# Patient Record
Sex: Female | Born: 1945 | Race: White | Hispanic: No | Smoking: Never smoker
Health system: Southern US, Community
[De-identification: ages and names within clinical notes are randomized; demographics above are authoritative.]

## PROBLEM LIST (undated history)

## (undated) DIAGNOSIS — G35 Multiple sclerosis: Secondary | ICD-10-CM

## (undated) DIAGNOSIS — G35D Multiple sclerosis, unspecified: Secondary | ICD-10-CM

---

## 2003-03-23 ENCOUNTER — Emergency Department (HOSPITAL_COMMUNITY): Admission: EM | Admit: 2003-03-23 | Discharge: 2003-03-23 | Payer: Self-pay | Admitting: Emergency Medicine

## 2011-07-06 ENCOUNTER — Encounter: Payer: Self-pay | Admitting: Family Medicine

## 2012-09-13 ENCOUNTER — Other Ambulatory Visit (HOSPITAL_COMMUNITY): Payer: Self-pay | Admitting: Family Medicine

## 2012-09-13 ENCOUNTER — Ambulatory Visit (HOSPITAL_COMMUNITY)
Admission: RE | Admit: 2012-09-13 | Discharge: 2012-09-13 | Disposition: A | Discharge status: Home / Self Care | Payer: Medicare Other | Source: Ambulatory Visit | Attending: Family Medicine | Admitting: Family Medicine

## 2012-09-13 ENCOUNTER — Ambulatory Visit (HOSPITAL_COMMUNITY): Admission: RE | Admit: 2012-09-13 | Payer: Self-pay | Source: Ambulatory Visit

## 2012-09-13 DIAGNOSIS — R609 Edema, unspecified: Secondary | ICD-10-CM

## 2012-09-13 DIAGNOSIS — R52 Pain, unspecified: Secondary | ICD-10-CM

## 2012-09-13 DIAGNOSIS — M79609 Pain in unspecified limb: Secondary | ICD-10-CM | POA: Insufficient documentation

## 2016-03-13 ENCOUNTER — Emergency Department (HOSPITAL_COMMUNITY): Payer: Medicare Other

## 2016-03-13 ENCOUNTER — Encounter (HOSPITAL_COMMUNITY): Payer: Self-pay | Admitting: Emergency Medicine

## 2016-03-13 ENCOUNTER — Emergency Department (HOSPITAL_COMMUNITY)
Admission: EM | Admit: 2016-03-13 | Discharge: 2016-03-14 | Disposition: A | Discharge status: Home / Self Care | Payer: Medicare Other | Attending: Emergency Medicine | Admitting: Emergency Medicine

## 2016-03-13 DIAGNOSIS — Z791 Long term (current) use of non-steroidal anti-inflammatories (NSAID): Secondary | ICD-10-CM | POA: Insufficient documentation

## 2016-03-13 DIAGNOSIS — S42202A Unspecified fracture of upper end of left humerus, initial encounter for closed fracture: Secondary | ICD-10-CM | POA: Diagnosis not present

## 2016-03-13 DIAGNOSIS — W108XXA Fall (on) (from) other stairs and steps, initial encounter: Secondary | ICD-10-CM | POA: Diagnosis not present

## 2016-03-13 DIAGNOSIS — S6992XA Unspecified injury of left wrist, hand and finger(s), initial encounter: Secondary | ICD-10-CM | POA: Diagnosis present

## 2016-03-13 DIAGNOSIS — S92352A Displaced fracture of fifth metatarsal bone, left foot, initial encounter for closed fracture: Secondary | ICD-10-CM | POA: Diagnosis not present

## 2016-03-13 DIAGNOSIS — Y999 Unspecified external cause status: Secondary | ICD-10-CM | POA: Diagnosis not present

## 2016-03-13 DIAGNOSIS — S92302A Fracture of unspecified metatarsal bone(s), left foot, initial encounter for closed fracture: Secondary | ICD-10-CM

## 2016-03-13 DIAGNOSIS — Z79899 Other long term (current) drug therapy: Secondary | ICD-10-CM | POA: Diagnosis not present

## 2016-03-13 DIAGNOSIS — Y939 Activity, unspecified: Secondary | ICD-10-CM | POA: Diagnosis not present

## 2016-03-13 DIAGNOSIS — Y92009 Unspecified place in unspecified non-institutional (private) residence as the place of occurrence of the external cause: Secondary | ICD-10-CM | POA: Diagnosis not present

## 2016-03-13 MED ORDER — MORPHINE SULFATE (PF) 4 MG/ML IV SOLN
4.0000 mg | Freq: Once | INTRAVENOUS | Status: AC
  Administered 2016-03-13: 4 mg via INTRAVENOUS
  Filled 2016-03-13: qty 1

## 2016-03-13 NOTE — ED Notes (Signed)
Bed: XQ11 Expected date:  Expected time:  Means of arrival:  Comments: 72 F fall/shoulder deformity

## 2016-03-13 NOTE — ED Provider Notes (Signed)
CSN: [id number]     Arrival date & time 03/13/16  2154 History   First MD Initiated Contact with Patient 03/13/16 2222     Chief Complaint  Patient presents with  . Fall  . Shoulder Injury     (Consider location/radiation/quality/duration/timing/severity/associated sxs/prior Treatment) HPI  70 year old female presents after a fall. She was on the porch when she accidentally missed a step on a 2 level porch. Patient fell and landed on the left arm and foot. Complaining of distal foot pain as well as severe left shoulder pain. Given 100 g fentanyl in route by EMS. Also has left forearm pain with abrasion. Last tetanus immunization was 5 years ago. Did not hit her head or lose consciousness. 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 No blood thinners. Pain is moderate at this time.  History reviewed. No pertinent past medical history. History reviewed. No pertinent past surgical history. History reviewed. No pertinent family history. Social History  Substance Use Topics  . Smoking status: Never Smoker   . Smokeless tobacco: Never Used  . Alcohol Use: No   OB History    No data available     Review of Systems  Respiratory: Negative for shortness of breath.   Cardiovascular: Negative for chest pain.  Gastrointestinal: Negative for vomiting.  Musculoskeletal: Positive for joint swelling and arthralgias.  Neurological: Positive for weakness (chronic, left sided; mild). Negative for dizziness and headaches.  All other systems reviewed and are negative.     Allergies  Penicillins  Home Medications   Prior to Admission medications   Medication Sig Start Date End Date Taking? Authorizing Provider  baclofen (LIORESAL) 10 MG tablet Take 10 mg by mouth 3 (three) times daily as needed for muscle spasms.   Yes Historical Provider, MD  Cholecalciferol (VITAMIN D) 2000 units CAPS Take 2,000 Units by mouth  daily.   Yes Historical Provider, MD  ibuprofen (ADVIL,MOTRIN) 200 MG tablet Take 200 mg by mouth every 6 (six) hours as needed (for pain.).   Yes Historical Provider, MD   BP 140/89 mmHg  Pulse 91  Temp(Src) 97.7 F (36.5 C) (Oral)  Resp 20  SpO2 99% Physical Exam  Constitutional: She is oriented to person, place, and time. She appears well-developed and well-nourished.  HENT:  Head: Normocephalic and atraumatic.  Right Ear: External ear normal.  Left Ear: External ear normal.  Nose: Nose normal.  Eyes: Right eye exhibits no discharge. Left eye exhibits no discharge.  Cardiovascular: Normal rate, regular rhythm and normal heart sounds.   Pulses:      Radial pulses are 2+ on the left side.       Dorsalis pedis pulses are 2+ on the right side, and 2+ on the left side.  Pulmonary/Chest: Effort normal and breath sounds normal.  Abdominal: Soft. There is no tenderness.  Musculoskeletal:       Left shoulder: She exhibits decreased range of motion, tenderness, bony tenderness and swelling. She exhibits normal pulse and normal strength.       Left elbow: No tenderness found.       Left ankle: She exhibits normal range of motion. No tenderness.       Left forearm: She exhibits tenderness and swelling.       Arms:      Left foot: There is tenderness (distal).       Feet:  Neurological: She is alert and  oriented to person, place, and time.  Skin: Skin is warm and dry.  Nursing note and vitals reviewed.   ED Course  Procedures (including critical care time) Labs Review Labs Reviewed - No data to display  Imaging Review Dg Forearm Left  03/13/2016  CLINICAL DATA:  Fall, shoulder injury EXAM: LEFT FOREARM - 2 VIEW COMPARISON:  None. FINDINGS: No fracture or dislocation is seen. The joint spaces are preserved. The visualized soft tissues are unremarkable. IMPRESSION: No fracture or dislocation is seen. Electronically Signed   By: Charline Bills M.D.   On: 03/13/2016 23:15   Dg  Ankle Complete Left  03/13/2016  CLINICAL DATA:  Fall EXAM: LEFT ANKLE COMPLETE - 3+ VIEW COMPARISON:  None. FINDINGS: No fracture or dislocation is seen. The ankle mortise is intact. The base of the fifth metatarsal is unremarkable. The visualized soft tissues are unremarkable. IMPRESSION: No fracture or dislocation is seen. Electronically Signed   By: Charline Bills M.D.   On: 03/13/2016 22:41   Ct Shoulder Left Wo Contrast  03/14/2016  CLINICAL DATA:  Left humeral fracture characterization. Initial encounter. EXAM: CT OF THE LEFT SHOULDER WITHOUT CONTRAST TECHNIQUE: Multidetector CT imaging was performed according to the standard protocol. Multiplanar CT image reconstructions were also generated. COMPARISON:  Radiography from earlier today FINDINGS: Highly comminuted proximal humerus with fracture involving surgical neck, anatomic neck, and both tuberosities. The head is displaced and rotated. Greater tuberosity is displaced by nearly 2 cm. The lesser tuberosity is not displaced over 1 cm. The humeral head is anteroinferiorly subluxed, but located. No scapular or distal clavicle fracture. Expected muscular edema and joint effusion. IMPRESSION: Comminuted proximal humerus fracture as described above. Electronically Signed   By: Marnee Spring M.D.   On: 03/14/2016 00:04   Dg Shoulder Left  03/13/2016  CLINICAL DATA:  Status post fall, left shoulder pain EXAM: LEFT SHOULDER - 2+ VIEW COMPARISON:  None. FINDINGS: Three-part humeral head/neck fracture. Fracture fragments are mildly displaced posteriorly and laterally. No evidence of dislocation. The visualized soft tissues are unremarkable. Visualized left lung is clear. IMPRESSION: Three-part humeral head/ neck fracture, as above. Electronically Signed   By: Charline Bills M.D.   On: 03/13/2016 22:41   Dg Foot Complete Left  03/13/2016  CLINICAL DATA:  Fall, dorsal foot pain EXAM: LEFT FOOT - COMPLETE 3+ VIEW COMPARISON:  None. FINDINGS: Mild  irregularity of the distal 5th metatarsal shaft. Correlate for point tenderness. Otherwise, no fracture is seen. Degenerative changes of the 2nd through 5th metacarpal heads. Joint spaces are essentially preserved. Osteopenia. The visualized soft tissues are unremarkable. IMPRESSION: Mild irregularity of the distal 5th metatarsal shaft. Correlate for point tenderness to exclude buckle fracture. Otherwise, no fracture is seen. Electronically Signed   By: Charline Bills M.D.   On: 03/13/2016 22:43   I have personally reviewed and evaluated these images and lab results as part of my medical decision-making.   EKG Interpretation None      MDM   Final diagnoses:  Closed fracture of left proximal humerus, initial encounter  Fracture of fifth metatarsal bone, left, closed, initial encounter    Patient with a proximal humerus fracture after a mechanical fall at home. Overall well-appearing but does have a significant proximal humerus fracture. Discussed with Dr. Luiz Blare, who recommends CT scan as he will likely fix. Will place in post-op shoe for her metatarsal fracture. Is NV intact. Placed in sling, given pain meds, counseled on side effects. Is having family stay and help  her tonight. D/c home in good condition.    Pricilla Loveless, MD 03/14/16 [phone]
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
## 2016-03-14 MED ORDER — OXYCODONE-ACETAMINOPHEN 5-325 MG PO TABS: 1.0000 | ORAL_TABLET | ORAL | Status: DC | PRN

## 2017-10-11 DIAGNOSIS — G35D Multiple sclerosis, unspecified: Secondary | ICD-10-CM | POA: Insufficient documentation

## 2017-11-14 IMAGING — CR DG ANKLE COMPLETE 3+V*L*
3 series · 3 of 3 positions shown · non-contrast
Comparison: None.

CLINICAL DATA: Fall

EXAM:
LEFT ANKLE COMPLETE - 3+ VIEW

[x ankle ap left]
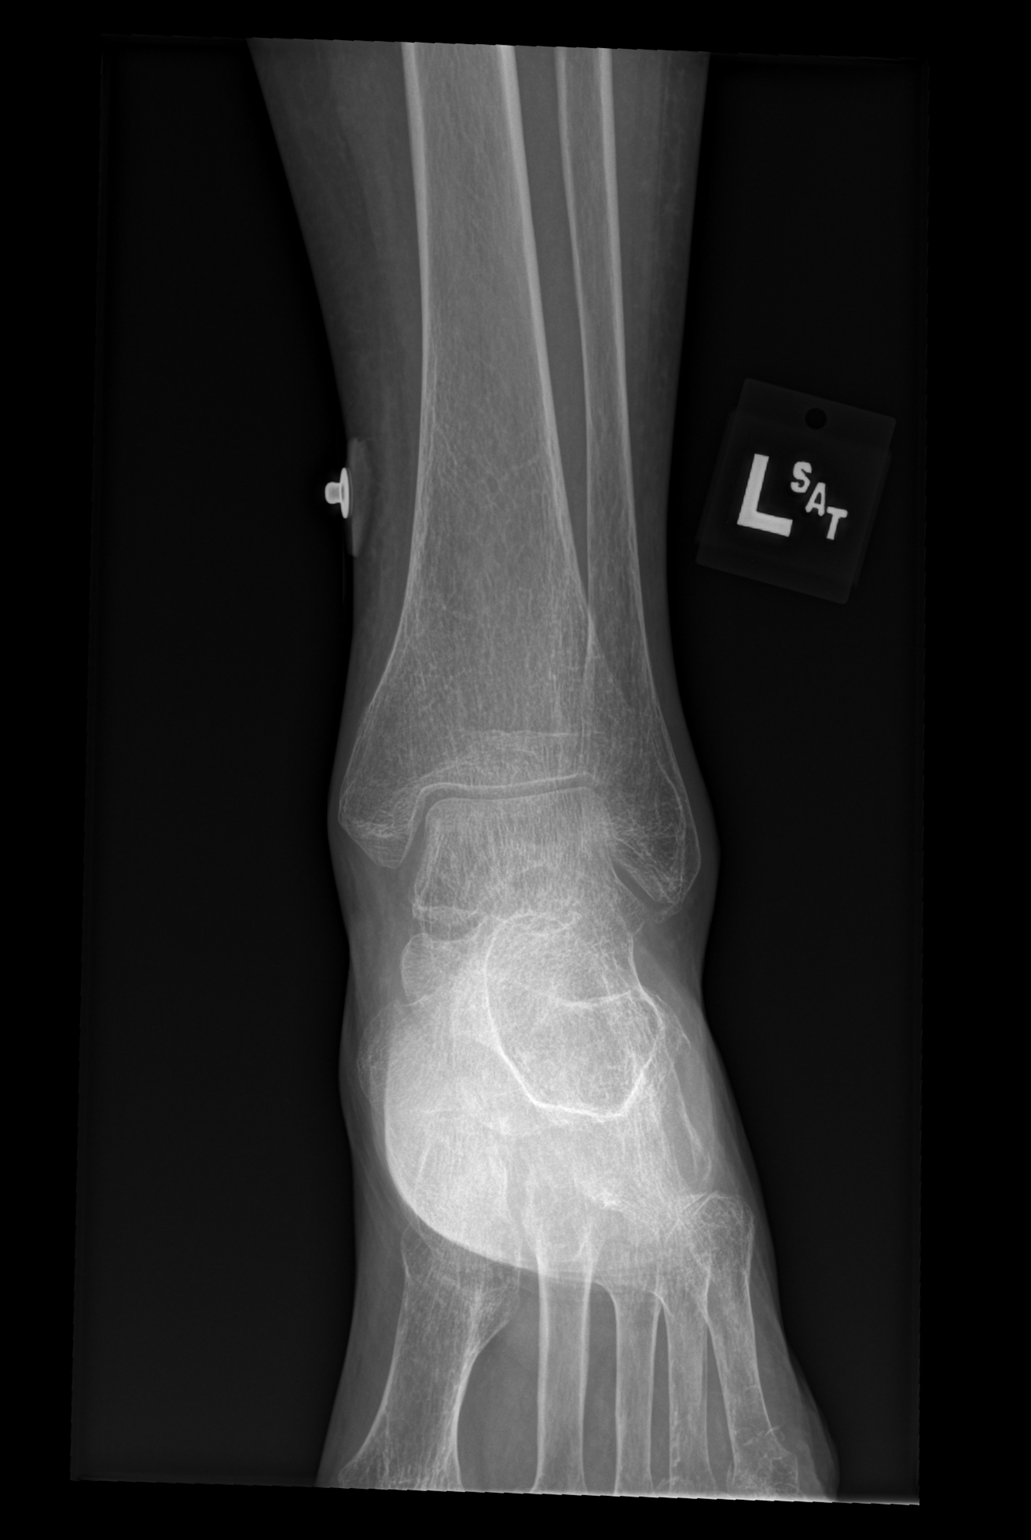

[x ankle obl left]
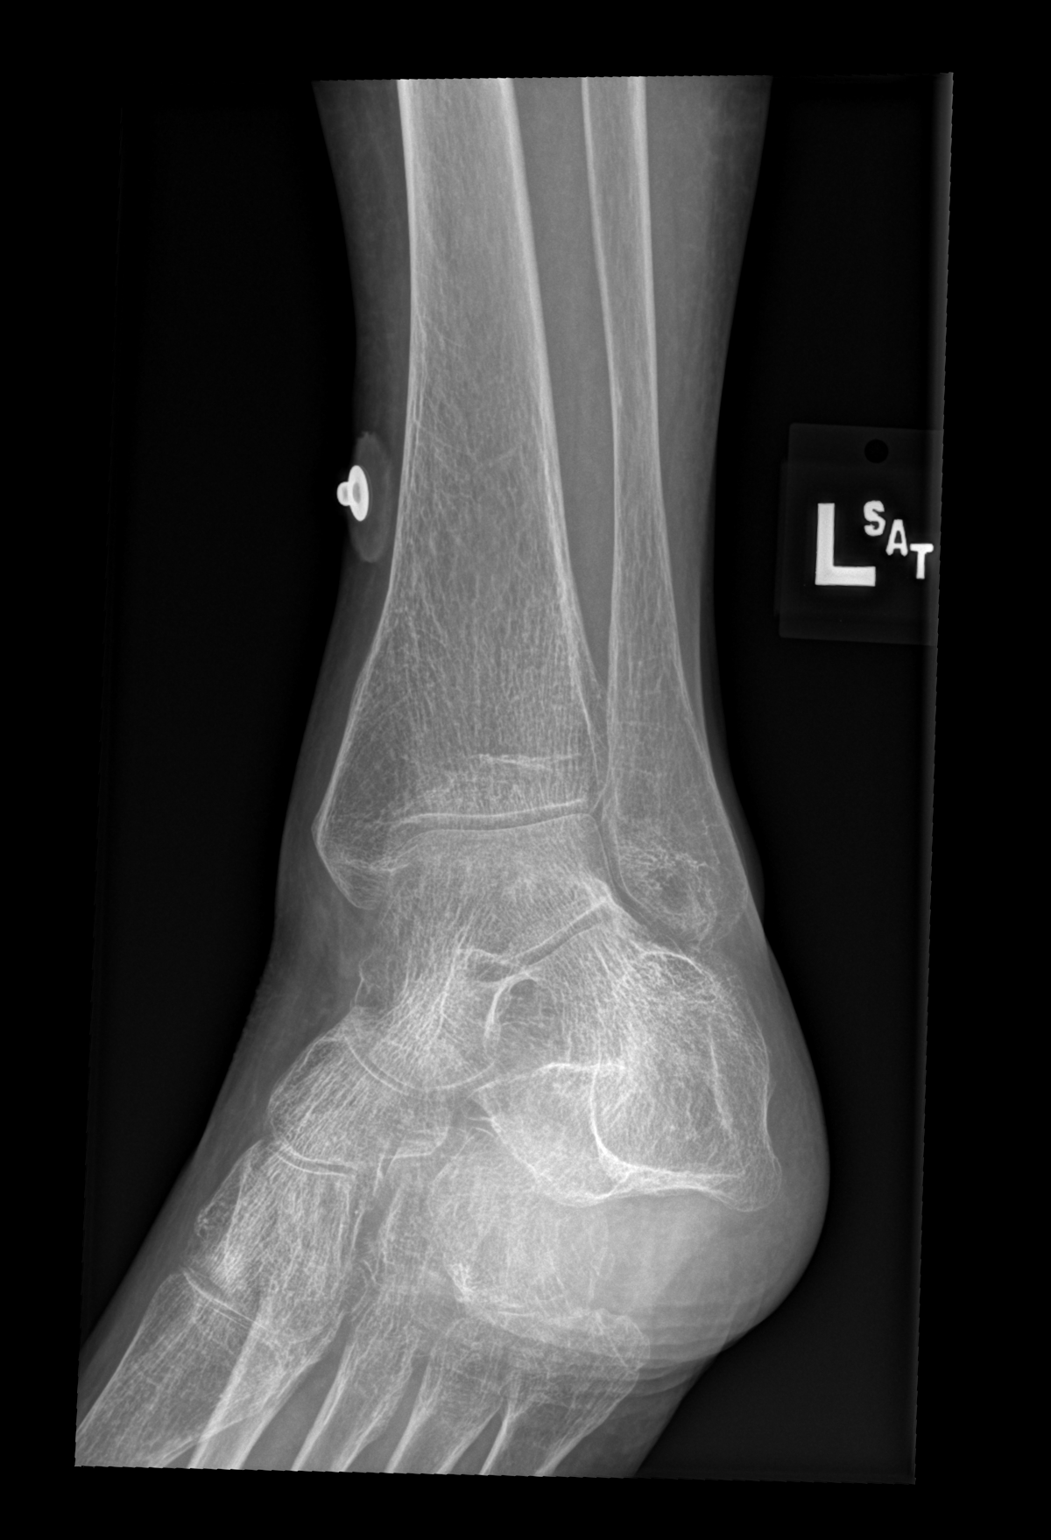

[x ankle lat left]
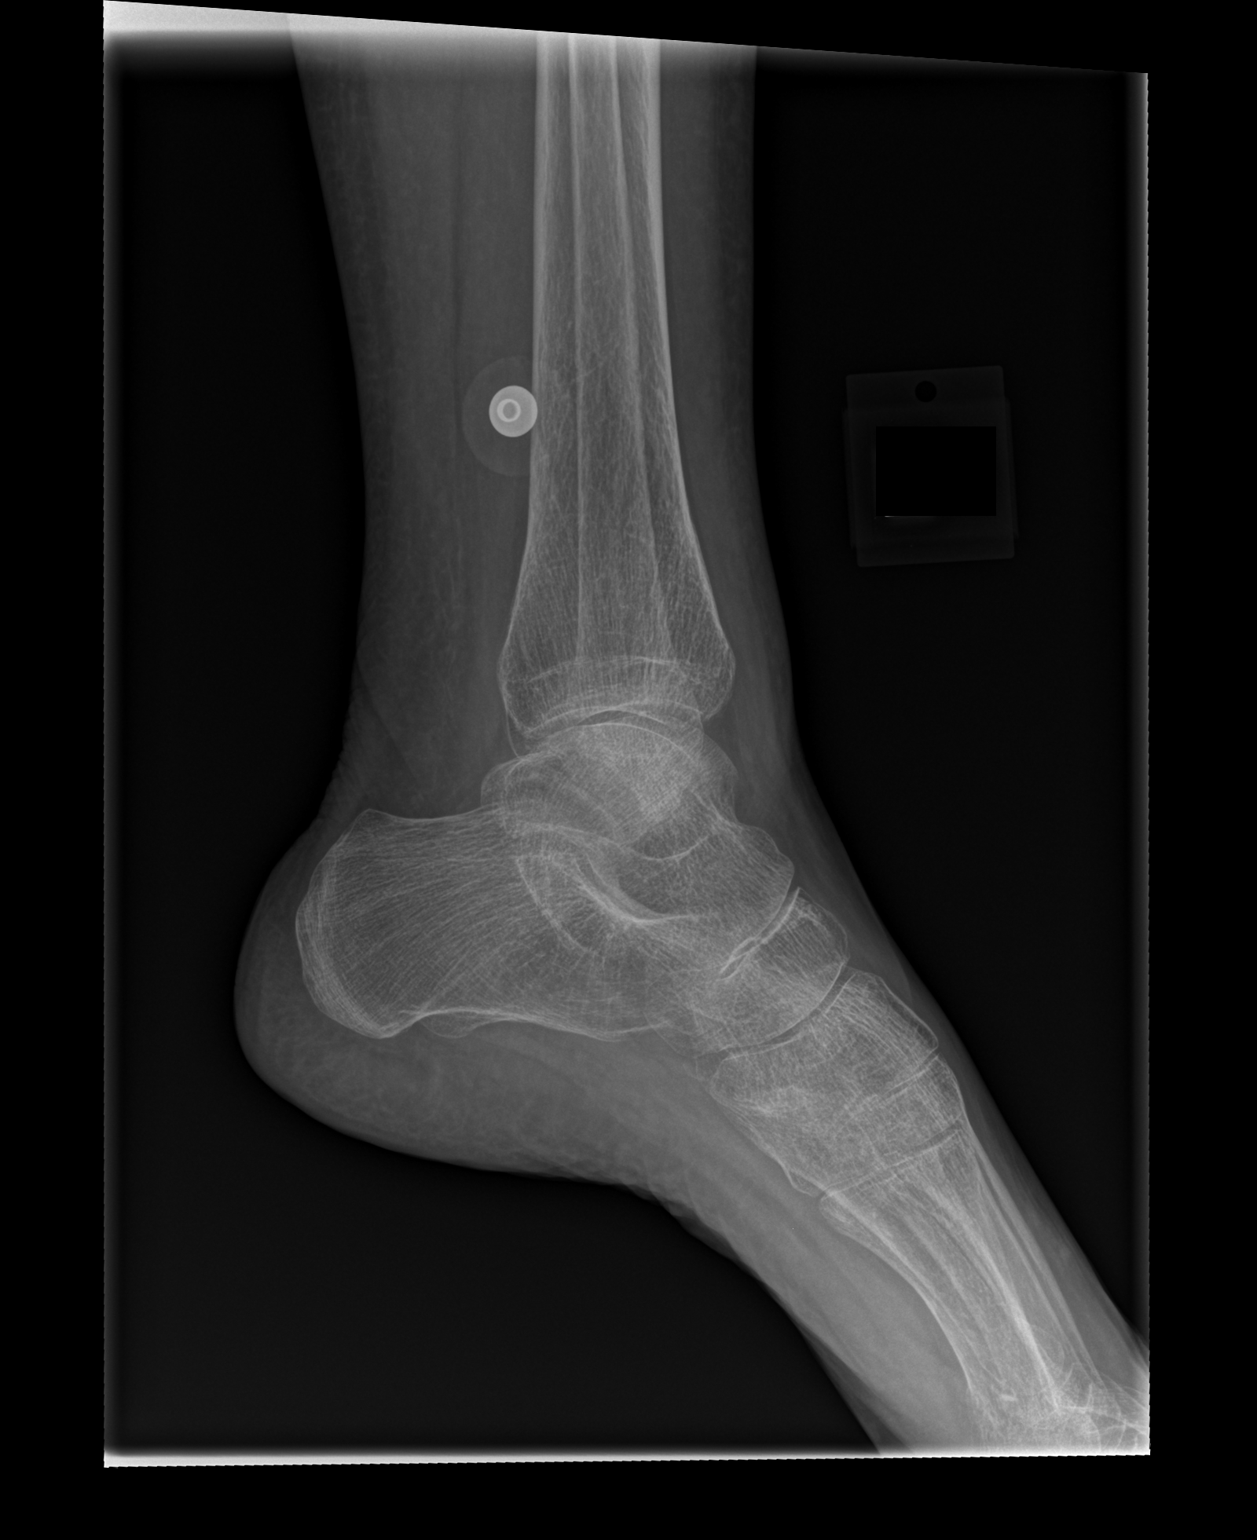

[3 of 3 positions shown; findings below may reference images not displayed]

FINDINGS: No fracture or dislocation is seen.

The ankle mortise is intact.

The base of the fifth metatarsal is unremarkable.

The visualized soft tissues are unremarkable.
IMPRESSION: No fracture or dislocation is seen.

## 2017-11-14 IMAGING — CR DG FOOT COMPLETE 3+V*L*
3 series · 3 of 3 positions shown · non-contrast
Comparison: None.

CLINICAL DATA: Fall, dorsal foot pain

EXAM:
LEFT FOOT - COMPLETE 3+ VIEW

[x foot ap left]
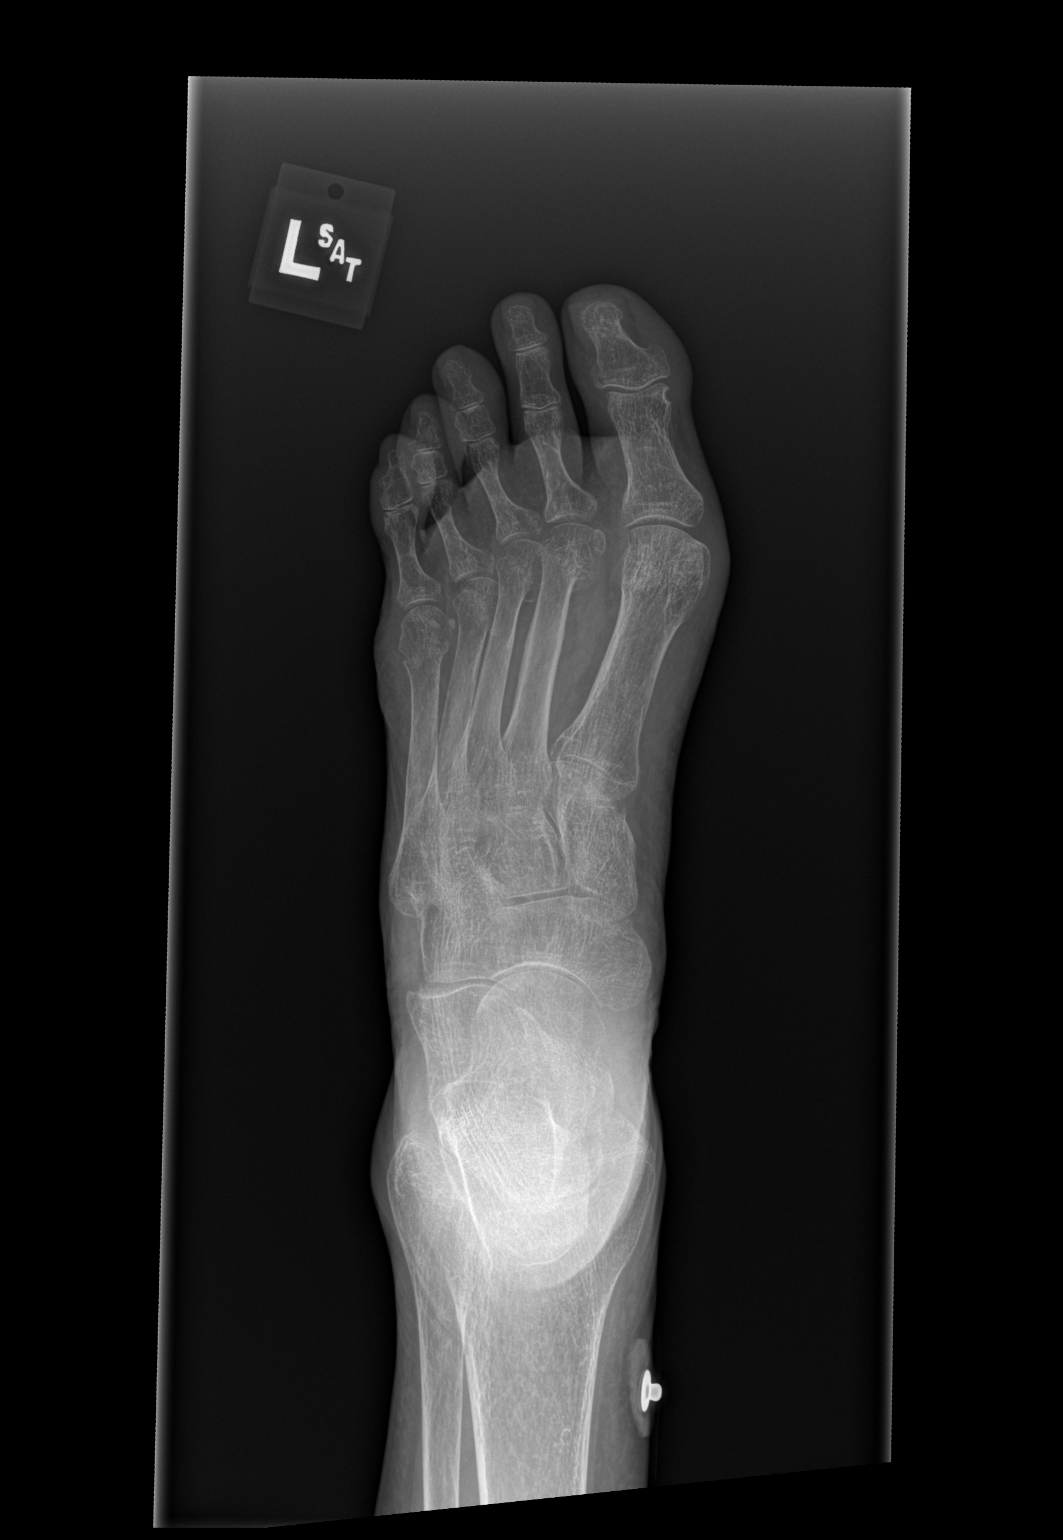

[x foot obl left]
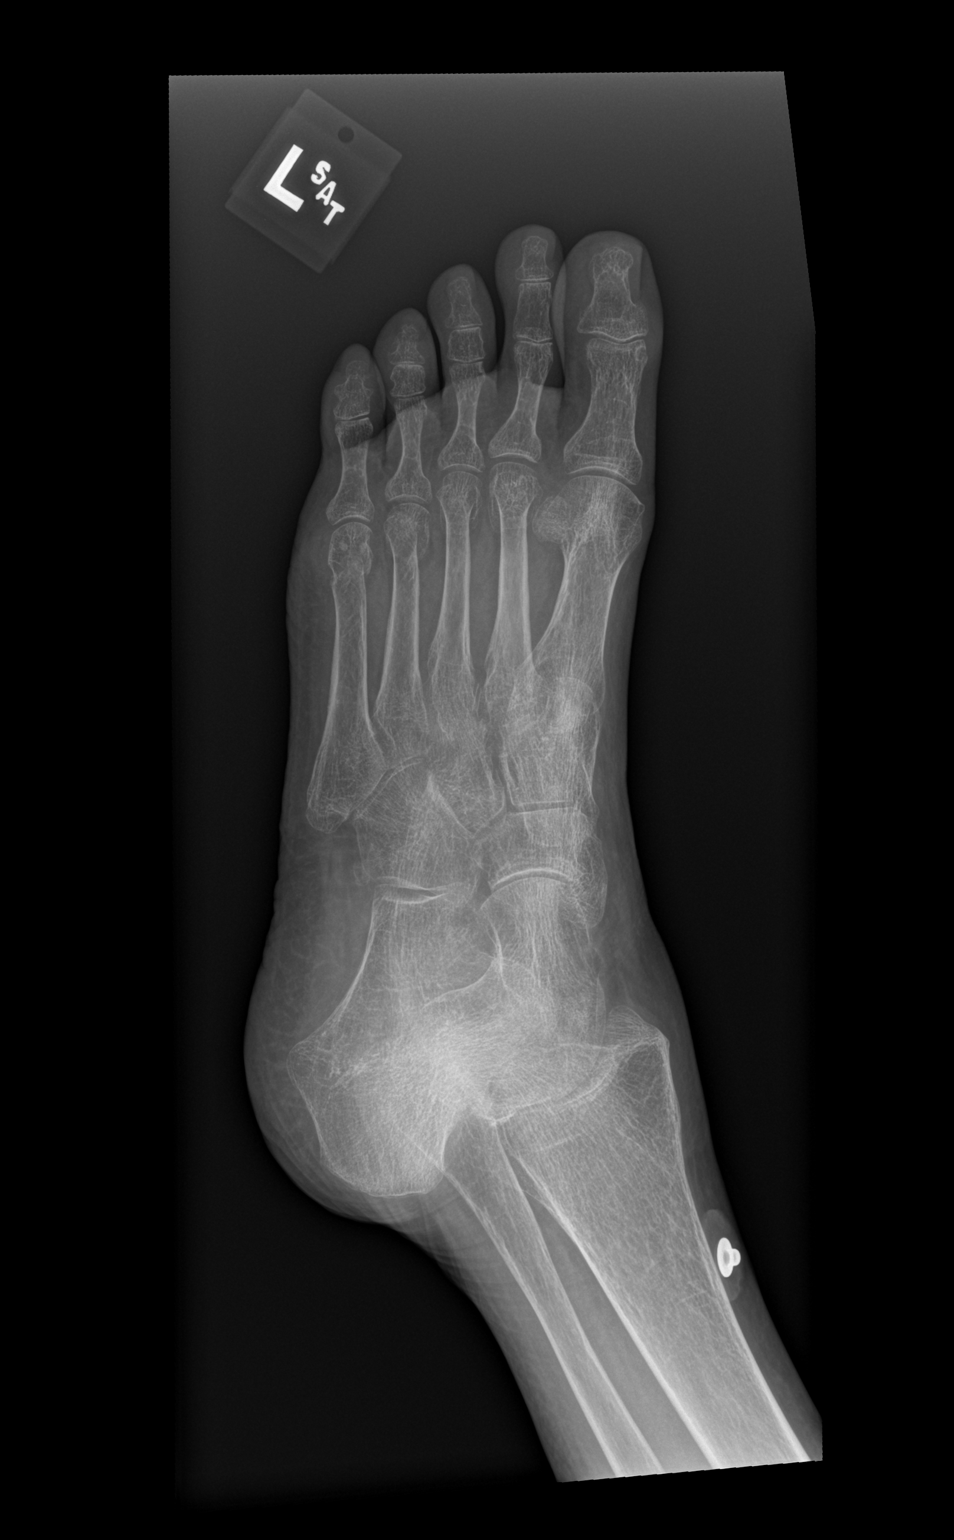

[x foot lat left]
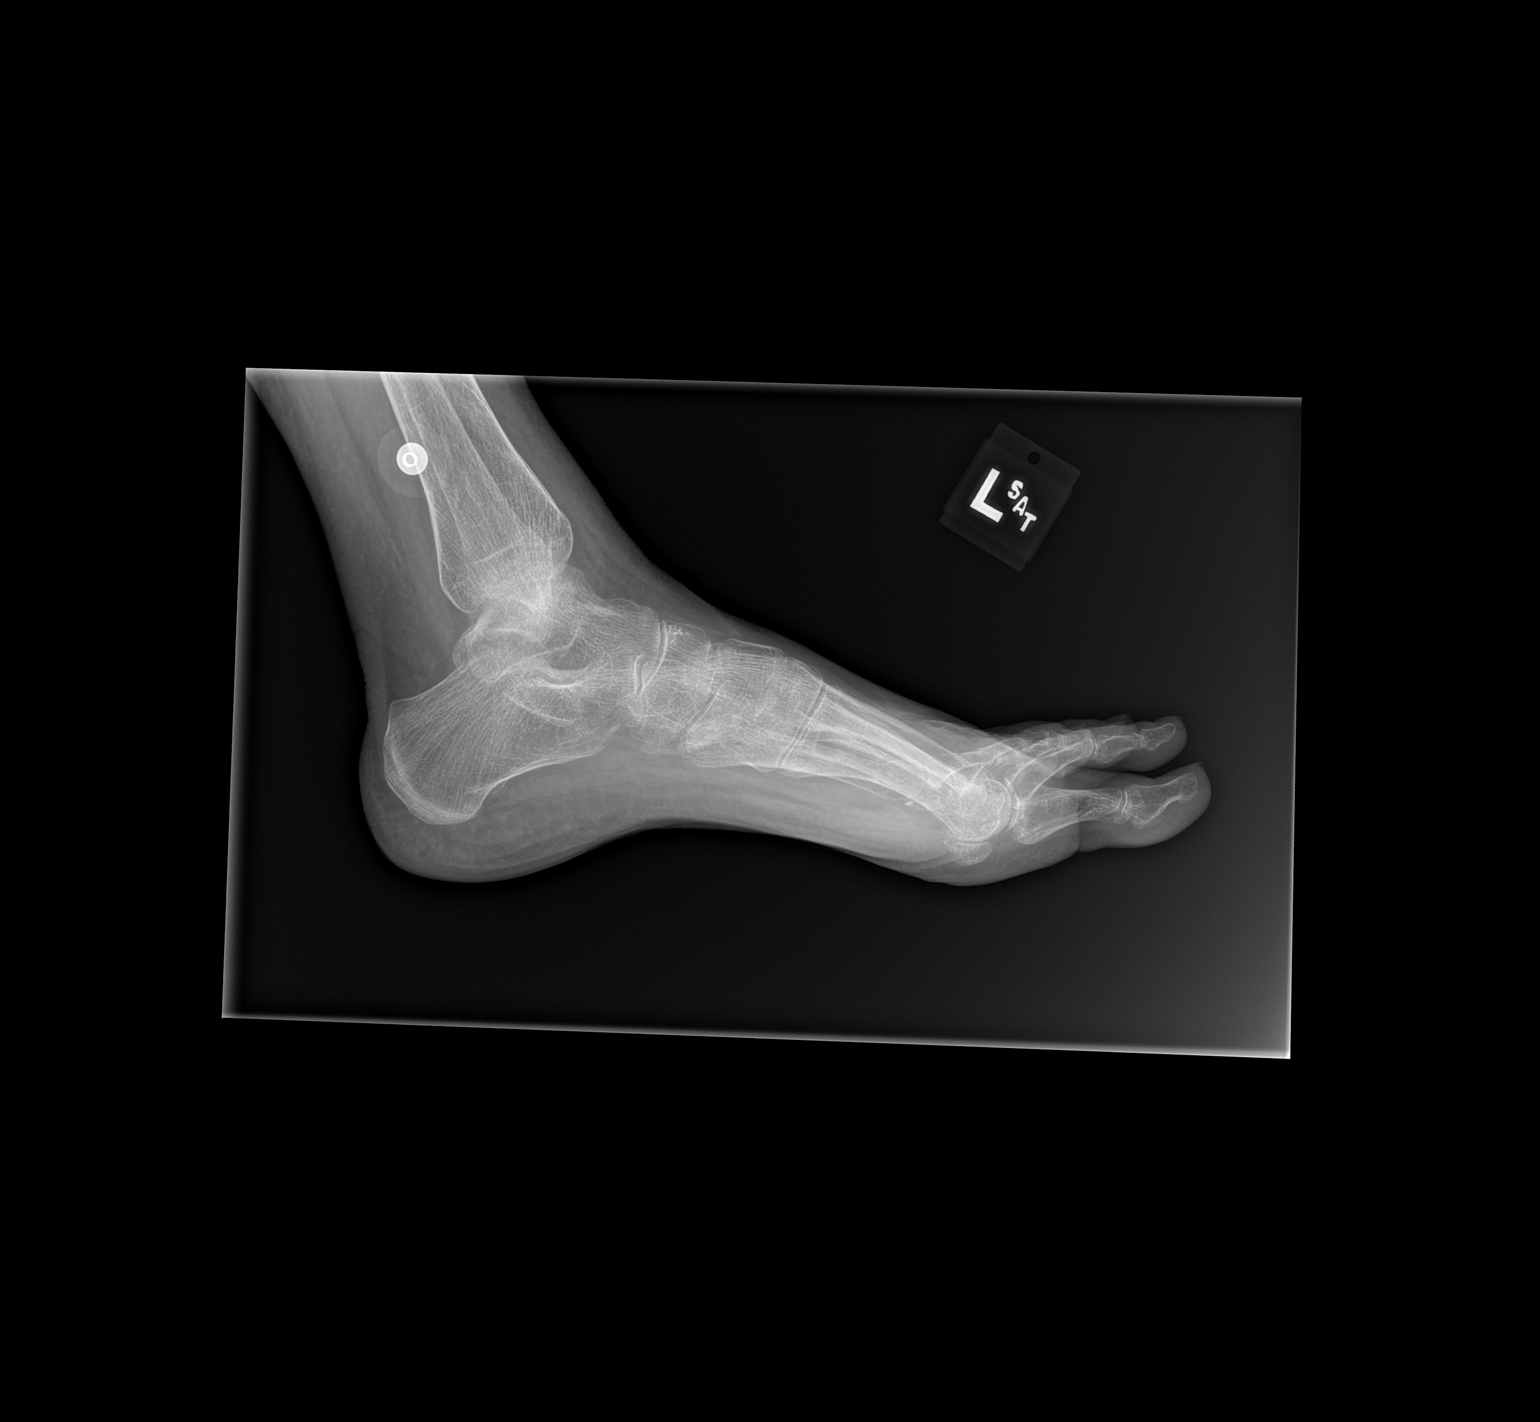

[3 of 3 positions shown; findings below may reference images not displayed]

FINDINGS: Mild irregularity of the distal 5th metatarsal shaft. Correlate for
point tenderness.

Otherwise, no fracture is seen.

Degenerative changes of the 2nd through 5th metacarpal heads.

Joint spaces are essentially preserved.

Osteopenia.

The visualized soft tissues are unremarkable.
IMPRESSION: Mild irregularity of the distal 5th metatarsal shaft. Correlate for
point tenderness to exclude buckle fracture.

Otherwise, no fracture is seen.

## 2023-07-29 ENCOUNTER — Emergency Department (HOSPITAL_COMMUNITY): Payer: No Typology Code available for payment source

## 2023-07-29 ENCOUNTER — Inpatient Hospital Stay (HOSPITAL_COMMUNITY)
Admission: EM | Admit: 2023-07-29 | Discharge: 2023-08-06 | DRG: 086 | Disposition: A | Discharge status: Home Health | Payer: No Typology Code available for payment source | Attending: General Surgery | Admitting: General Surgery

## 2023-07-29 ENCOUNTER — Other Ambulatory Visit: Payer: Self-pay

## 2023-07-29 ENCOUNTER — Encounter (HOSPITAL_COMMUNITY): Payer: Self-pay

## 2023-07-29 DIAGNOSIS — R402141 Coma scale, eyes open, spontaneous, in the field [EMT or ambulance]: Secondary | ICD-10-CM | POA: Diagnosis present

## 2023-07-29 DIAGNOSIS — S2231XA Fracture of one rib, right side, initial encounter for closed fracture: Secondary | ICD-10-CM | POA: Diagnosis present

## 2023-07-29 DIAGNOSIS — R402361 Coma scale, best motor response, obeys commands, in the field [EMT or ambulance]: Secondary | ICD-10-CM | POA: Diagnosis present

## 2023-07-29 DIAGNOSIS — R402251 Coma scale, best verbal response, oriented, in the field [EMT or ambulance]: Secondary | ICD-10-CM | POA: Diagnosis present

## 2023-07-29 DIAGNOSIS — S8255XA Nondisplaced fracture of medial malleolus of left tibia, initial encounter for closed fracture: Secondary | ICD-10-CM | POA: Diagnosis present

## 2023-07-29 DIAGNOSIS — G5692 Unspecified mononeuropathy of left upper limb: Secondary | ICD-10-CM | POA: Diagnosis present

## 2023-07-29 DIAGNOSIS — S065XAA Traumatic subdural hemorrhage with loss of consciousness status unknown, initial encounter: Principal | ICD-10-CM | POA: Diagnosis present

## 2023-07-29 DIAGNOSIS — Y92488 Other paved roadways as the place of occurrence of the external cause: Secondary | ICD-10-CM

## 2023-07-29 DIAGNOSIS — Z79899 Other long term (current) drug therapy: Secondary | ICD-10-CM | POA: Diagnosis not present

## 2023-07-29 DIAGNOSIS — Z880 Allergy status to penicillin: Secondary | ICD-10-CM | POA: Diagnosis not present

## 2023-07-29 DIAGNOSIS — S065X0A Traumatic subdural hemorrhage without loss of consciousness, initial encounter: Secondary | ICD-10-CM | POA: Diagnosis present

## 2023-07-29 DIAGNOSIS — G35 Multiple sclerosis: Secondary | ICD-10-CM | POA: Diagnosis present

## 2023-07-29 HISTORY — DX: Multiple sclerosis, unspecified: G35.D

## 2023-07-29 HISTORY — DX: Multiple sclerosis: G35

## 2023-07-29 LAB — MRSA NEXT GEN BY PCR, NASAL: MRSA by PCR Next Gen: NOT DETECTED

## 2023-07-29 MED ORDER — ORAL CARE MOUTH RINSE: 15.0000 mL | OROMUCOSAL | Status: DC | PRN

## 2023-07-29 MED ORDER — CHLORHEXIDINE GLUCONATE CLOTH 2 % EX PADS
6.0000 | MEDICATED_PAD | Freq: Every day | CUTANEOUS | Status: DC
  Administered 2023-07-29 – 2023-07-30 (×2): 6 via TOPICAL

## 2023-07-29 MED ORDER — FENTANYL CITRATE PF 50 MCG/ML IJ SOSY: 50.0000 ug | PREFILLED_SYRINGE | Freq: Once | INTRAMUSCULAR | Status: DC

## 2023-07-29 MED ORDER — POLYETHYLENE GLYCOL 3350 17 G PO PACK
17.0000 g | PACK | Freq: Every day | ORAL | Status: DC | PRN
Start: 2023-07-29 — End: 2023-08-01

## 2023-07-29 MED ORDER — ONDANSETRON 4 MG PO TBDP
4.0000 mg | ORAL_TABLET | Freq: Four times a day (QID) | ORAL | Status: DC | PRN
Start: 2023-07-29 — End: 2023-08-01
  Filled 2023-07-29: qty 1

## 2023-07-29 MED ORDER — ONDANSETRON HCL 4 MG/2ML IJ SOLN
4.0000 mg | Freq: Four times a day (QID) | INTRAMUSCULAR | Status: DC | PRN
Start: 2023-07-29 — End: 2023-08-01
  Administered 2023-07-30 – 2023-07-31 (×2): 4 mg via INTRAVENOUS
  Filled 2023-07-29 (×2): qty 2

## 2023-07-29 MED ORDER — OXYCODONE HCL 5 MG PO TABS
10.0000 mg | ORAL_TABLET | ORAL | Status: DC | PRN
  Administered 2023-07-29 – 2023-07-30 (×3): 10 mg via ORAL
  Filled 2023-07-29 (×3): qty 2

## 2023-07-29 MED ORDER — MORPHINE SULFATE (PF) 2 MG/ML IV SOLN
2.0000 mg | INTRAVENOUS | Status: DC | PRN
  Administered 2023-07-30: 2 mg via INTRAVENOUS
  Filled 2023-07-29: qty 1

## 2023-07-29 MED ORDER — SODIUM CHLORIDE 0.9 % IV SOLN: INTRAVENOUS | Status: AC

## 2023-07-29 MED ORDER — HYDRALAZINE HCL 20 MG/ML IJ SOLN: 10.0000 mg | INTRAMUSCULAR | Status: DC | PRN

## 2023-07-29 MED ORDER — LEVETIRACETAM IN NACL 500 MG/100ML IV SOLN
500.0000 mg | Freq: Two times a day (BID) | INTRAVENOUS | Status: DC
  Administered 2023-07-29 – 2023-07-31 (×4): 500 mg via INTRAVENOUS
  Filled 2023-07-29 (×4): qty 100

## 2023-07-29 MED ORDER — OXYCODONE HCL 5 MG PO TABS
5.0000 mg | ORAL_TABLET | ORAL | Status: DC | PRN
  Administered 2023-07-30 – 2023-08-01 (×3): 5 mg via ORAL
  Filled 2023-07-29 (×3): qty 1

## 2023-07-29 MED ORDER — ACETAMINOPHEN 500 MG PO TABS
1000.0000 mg | ORAL_TABLET | Freq: Four times a day (QID) | ORAL | Status: DC
  Administered 2023-07-29 – 2023-08-06 (×29): 1000 mg via ORAL
  Filled 2023-07-29 (×30): qty 2

## 2023-07-29 MED ORDER — DOCUSATE SODIUM 100 MG PO CAPS
100.0000 mg | ORAL_CAPSULE | Freq: Two times a day (BID) | ORAL | Status: DC
  Administered 2023-07-29 – 2023-08-06 (×16): 100 mg via ORAL
  Filled 2023-07-29 (×16): qty 1

## 2023-07-29 MED ORDER — ACETAMINOPHEN 325 MG PO TABS
650.0000 mg | ORAL_TABLET | Freq: Once | ORAL | Status: AC
  Administered 2023-07-29: 650 mg via ORAL
  Filled 2023-07-29: qty 2

## 2023-07-29 MED ORDER — BACLOFEN 10 MG PO TABS
10.0000 mg | ORAL_TABLET | Freq: Three times a day (TID) | ORAL | Status: DC | PRN
  Administered 2023-07-31: 10 mg via ORAL
  Filled 2023-07-29: qty 1
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
## 2023-07-29 NOTE — Progress Notes (Signed)
Orthopedic Tech Progress Note Patient Details:  Brenda Sherman [birth year] [id number]  Ortho Devices Type of Ortho Device: CAM walker Ortho Device/Splint Location: LLE Ortho Device/Splint Interventions: Ordered, Application, Adjustment   Post Interventions Patient Tolerated: [name] 07/29/2023, 9:42 PM
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
## 2023-07-30 ENCOUNTER — Inpatient Hospital Stay (HOSPITAL_COMMUNITY): Payer: No Typology Code available for payment source

## 2023-07-30 LAB — BASIC METABOLIC PANEL
Anion gap: 7 (ref 5–15)
BUN: 13 mg/dL (ref 8–23)
CO2: 23 mmol/L (ref 22–32)
Calcium: 8.2 mg/dL — ABNORMAL LOW (ref 8.9–10.3)
Chloride: 107 mmol/L (ref 98–111)
Creatinine, Ser: 0.75 mg/dL (ref 0.44–1.00)
GFR, Estimated: >60 mL/min (ref >60)
Glucose, Bld: 125 mg/dL — ABNORMAL HIGH (ref 70–99)
Potassium: 3.6 mmol/L (ref 3.5–5.1)
Sodium: 137 mmol/L (ref 135–145)

## 2023-07-30 LAB — CBC
HCT: 24.9 % — ABNORMAL LOW (ref 36.0–46.0)
Hemoglobin: 8.1 g/dL — ABNORMAL LOW (ref 12.0–15.0)
MCH: 26.8 pg (ref 26.0–34.0)
MCHC: 32.5 g/dL (ref 30.0–36.0)
MCV: 82.5 fL (ref 80.0–100.0)
Platelets: 227 10*3/uL (ref 150–400)
RBC: 3.02 MIL/uL — ABNORMAL LOW (ref 3.87–5.11)
RDW: 15 % (ref 11.5–15.5)
WBC: 7 10*3/uL (ref 4.0–10.5)
nRBC: 0 % (ref 0.0–0.2)

## 2023-07-30 NOTE — Progress Notes (Signed)
Transition of Care [facility]) - CAGE-AID Screening   Patient Details  Name: Brenda Sherman MRN: [id number] Date of Birth: December 11, 1945  Hewitt Shorts, RN Trauma Response Nurse Phone Number: [phone] 07/30/2023, 9:19 AM   CAGE-AID Screening:    Have You Ever Felt You Ought to Cut Down on Your Drinking or Drug Use?: No Have People Annoyed You By Critizing Your Drinking Or Drug Use?: No Have You Felt Bad Or Guilty About Your Drinking Or Drug Use?: No Have You Ever Had a Drink or Used Drugs First Thing In The Morning to Steady Your Nerves or to Get Rid of a Hangover?: No CAGE-AID Score: 0  Substance Abuse Education Offered: No (No services needed)

## 2023-07-30 NOTE — Progress Notes (Signed)
Trauma/Critical Care Follow Up Note  Subjective:    Overnight Issues: No acute issues.  Reports that overall she feels pretty good.  CTH stable.  No new complaints.   Objective:  Vital signs for last 24 hours: Temp:  [97.8 F (36.6 C)-99.2 F (37.3 C)] 97.8 F (36.6 C) (12/07 0357) Pulse Rate:  [67-107] 71 (12/07 0700) Resp:  [10-20] 20 (12/07 0700) BP: (110-186)/(50-78) 152/69 (12/07 0700) SpO2:  [91 %-100 %] 100 % (12/07 0700) Weight:  [67.9 kg] 67.9 kg (12/06 2100)  Hemodynamic parameters for last 24 hours:    Intake/Output from previous day: 12/06 0701 - 12/07 0700 In: 565.3 [I.V.:465.3; IV Piggyback:100] Out: 250 [Urine:250]  Intake/Output this shift: No intake/output data recorded.  Vent settings for last 24 hours:    Physical Exam:  Gen: comfortable, no distress Neuro: follows commands, alert, communicative HEENT: PERRL Neck:  atraumatic CV: RRR Pulm: unlabored breathing on nasal cannula Abd: soft, NT Extr: wwp, no edema  Results for orders placed or performed during the hospital encounter of 07/29/23 (from the past 24 hour(s))  MRSA Next Gen by PCR, Nasal     Status: None   Collection Time: 07/29/23  8:47 PM   Specimen: Nasal Mucosa; Nasal Swab  Result Value Ref Range   MRSA by PCR Next Gen NOT DETECTED NOT DETECTED  CBC     Status: Abnormal   Collection Time: 07/30/23  3:50 AM  Result Value Ref Range   WBC 7.0 4.0 - 10.5 K/uL   RBC 3.02 (L) 3.87 - 5.11 MIL/uL   Hemoglobin 8.1 (L) 12.0 - 15.0 g/dL   HCT 16.1 (L) 09.6 - 04.5 %   MCV 82.5 80.0 - 100.0 fL   MCH 26.8 26.0 - 34.0 pg   MCHC 32.5 30.0 - 36.0 g/dL   RDW 40.9 81.1 - 91.4 %   Platelets 227 150 - 400 K/uL   nRBC 0.0 0.0 - 0.2 %  Basic metabolic panel     Status: Abnormal   Collection Time: 07/30/23  3:50 AM  Result Value Ref Range   Sodium 137 135 - 145 mmol/L   Potassium 3.6 3.5 - 5.1 mmol/L   Chloride 107 98 - 111 mmol/L   CO2 23 22 - 32 mmol/L   Glucose, Bld 125 (H) 70 - 99 mg/dL    BUN 13 8 - 23 mg/dL   Creatinine, Ser 7.82 0.44 - 1.00 mg/dL   Calcium 8.2 (L) 8.9 - 10.3 mg/dL   GFR, Estimated >95 >62 mL/min   Anion gap 7 5 - 15    Assessment & Plan: The plan of care was discussed with the bedside nurse for the day who is in agreement with this plan and no additional concerns were raised.   Present on Admission:  SDH (subdural hematoma) (HCC)   TBI/L SDH 5mm - Dr. Lovell Sheehan consulted. Repeat CTH stable.  Will appreciate recs R 9th rib FX - multimodal pain control, pulmonary toilet L medial mal FX - Dr. Victorino Dike consulted.  Cam boot, WBAT. PT/OT.  Follow up in 2-3 weeks w/ XR.    FEN - regular diet DVT - SCDs, hold chemical ppx due to bleeding concerns Dispo -  pending PT/OT eval , transfer out of ICU today  Critical Care Total Time: 30 minutes  Moise Boring General Surgery Please use [email or web address] to contact on call provider  07/30/2023  *Care during the described time interval was provided by me. I have reviewed this patient's available data,  including medical history, events of note, physical examination and test results as part of my evaluation.
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
## 2023-07-31 LAB — CBC
HCT: 26 % — ABNORMAL LOW (ref 36.0–46.0)
Hemoglobin: 8 g/dL — ABNORMAL LOW (ref 12.0–15.0)
MCH: 26.6 pg (ref 26.0–34.0)
MCHC: 30.8 g/dL (ref 30.0–36.0)
MCV: 86.4 fL (ref 80.0–100.0)
Platelets: 185 10*3/uL (ref 150–400)
RBC: 3.01 MIL/uL — ABNORMAL LOW (ref 3.87–5.11)
RDW: 14.7 % (ref 11.5–15.5)
WBC: 6.6 10*3/uL (ref 4.0–10.5)
nRBC: 0 % (ref 0.0–0.2)

## 2023-07-31 LAB — BASIC METABOLIC PANEL
Anion gap: 9 (ref 5–15)
BUN: 10 mg/dL (ref 8–23)
CO2: 24 mmol/L (ref 22–32)
Calcium: 8.3 mg/dL — ABNORMAL LOW (ref 8.9–10.3)
Chloride: 104 mmol/L (ref 98–111)
Creatinine, Ser: 0.65 mg/dL (ref 0.44–1.00)
GFR, Estimated: >60 mL/min (ref >60)
Glucose, Bld: 94 mg/dL (ref 70–99)
Potassium: 3.5 mmol/L (ref 3.5–5.1)
Sodium: 137 mmol/L (ref 135–145)

## 2023-07-31 MED ORDER — LEVETIRACETAM 500 MG PO TABS
500.0000 mg | ORAL_TABLET | Freq: Two times a day (BID) | ORAL | Status: DC
  Administered 2023-07-31 – 2023-08-01 (×2): 500 mg via ORAL
  Filled 2023-07-31 (×2): qty 1

## 2023-07-31 MED ORDER — ALUM & MAG HYDROXIDE-SIMETH 200-200-20 MG/5ML PO SUSP
30.0000 mL | Freq: Four times a day (QID) | ORAL | Status: DC | PRN
  Administered 2023-07-31: 30 mL via ORAL
  Filled 2023-07-31: qty 30

## 2023-07-31 NOTE — Progress Notes (Signed)
Pt transferred onto 4NP-8 from 4N-17. A&O x4; no pain except with movement. Vitals recorded and assessment completed. Family and personal belongings at bedside. Pt oriented to staff and equipment. Call bell within reach and bed alarm on.
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
## 2023-07-31 NOTE — Progress Notes (Signed)
Inpatient Rehab Admissions Coordinator:  ? ?Per therapy recommendations,  patient was screened for CIR candidacy by Devaney Segers, MS, CCC-SLP. At this time, Pt. Appears to be a a potential candidate for CIR. I will place   order for rehab consult per protocol for full assessment. Please contact me any with questions. ? ?Trine Fread, MS, CCC-SLP ?Rehab Admissions Coordinator  ?[phone] (celll) ?[phone] (office) ? ?
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
## 2023-08-01 LAB — CBC
HCT: 25.7 % — ABNORMAL LOW (ref 36.0–46.0)
Hemoglobin: 8.1 g/dL — ABNORMAL LOW (ref 12.0–15.0)
MCH: 26.6 pg (ref 26.0–34.0)
MCHC: 31.5 g/dL (ref 30.0–36.0)
MCV: 84.5 fL (ref 80.0–100.0)
Platelets: 213 10*3/uL (ref 150–400)
RBC: 3.04 MIL/uL — ABNORMAL LOW (ref 3.87–5.11)
RDW: 14.6 % (ref 11.5–15.5)
WBC: 6.7 10*3/uL (ref 4.0–10.5)
nRBC: 0 % (ref 0.0–0.2)

## 2023-08-01 LAB — BASIC METABOLIC PANEL
Anion gap: 9 (ref 5–15)
BUN: 10 mg/dL (ref 8–23)
CO2: 24 mmol/L (ref 22–32)
Calcium: 8.3 mg/dL — ABNORMAL LOW (ref 8.9–10.3)
Chloride: 103 mmol/L (ref 98–111)
Creatinine, Ser: 0.68 mg/dL (ref 0.44–1.00)
GFR, Estimated: >60 mL/min (ref >60)
Glucose, Bld: 94 mg/dL (ref 70–99)
Potassium: 3.2 mmol/L — ABNORMAL LOW (ref 3.5–5.1)
Sodium: 136 mmol/L (ref 135–145)

## 2023-08-01 MED ORDER — POTASSIUM CHLORIDE 20 MEQ PO PACK
40.0000 meq | PACK | ORAL | Status: AC
  Administered 2023-08-01: 40 meq via ORAL
  Filled 2023-08-01: qty 2

## 2023-08-01 MED ORDER — ONDANSETRON HCL 4 MG/2ML IJ SOLN
4.0000 mg | INTRAMUSCULAR | Status: DC | PRN
Start: 2023-08-01 — End: 2023-08-01

## 2023-08-01 MED ORDER — POLYETHYLENE GLYCOL 3350 17 G PO PACK
17.0000 g | PACK | Freq: Every day | ORAL | Status: DC
  Administered 2023-08-01 – 2023-08-06 (×6): 17 g via ORAL
  Filled 2023-08-01 (×6): qty 1

## 2023-08-01 MED ORDER — FAMOTIDINE 20 MG PO TABS: 20.0000 mg | ORAL_TABLET | Freq: Every day | ORAL | Status: DC | PRN

## 2023-08-01 MED ORDER — ENOXAPARIN SODIUM 30 MG/0.3ML IJ SOSY
30.0000 mg | PREFILLED_SYRINGE | Freq: Two times a day (BID) | INTRAMUSCULAR | Status: DC
  Administered 2023-08-01 – 2023-08-06 (×10): 30 mg via SUBCUTANEOUS
  Filled 2023-08-01 (×10): qty 0.3

## 2023-08-01 MED ORDER — OXYCODONE HCL 5 MG PO TABS
5.0000 mg | ORAL_TABLET | ORAL | Status: DC | PRN
Start: 2023-08-01 — End: 2023-08-01

## 2023-08-01 MED ORDER — BISACODYL 5 MG PO TBEC
10.0000 mg | DELAYED_RELEASE_TABLET | Freq: Once | ORAL | Status: AC
  Administered 2023-08-01: 10 mg via ORAL
  Filled 2023-08-01: qty 2

## 2023-08-01 MED ORDER — OXYCODONE HCL 5 MG PO TABS
5.0000 mg | ORAL_TABLET | ORAL | Status: DC | PRN
Start: 2023-08-01 — End: 2023-08-06
  Administered 2023-08-02: 10 mg via ORAL
  Administered 2023-08-04 – 2023-08-05 (×2): 5 mg via ORAL
  Filled 2023-08-01 (×2): qty 1
  Filled 2023-08-01: qty 2
  Filled 2023-08-01: qty 1
  Filled 2023-08-01: qty 2

## 2023-08-01 MED ORDER — ONDANSETRON 4 MG PO TBDP: 4.0000 mg | ORAL_TABLET | ORAL | Status: DC | PRN

## 2023-08-01 MED ORDER — LIDOCAINE 5 % EX PTCH
1.0000 | MEDICATED_PATCH | CUTANEOUS | Status: DC
  Administered 2023-08-01 – 2023-08-06 (×6): 1 via TRANSDERMAL
  Filled 2023-08-01 (×6): qty 1

## 2023-08-01 MED ORDER — OXYCODONE HCL 5 MG PO TABS
5.0000 mg | ORAL_TABLET | ORAL | Status: DC | PRN
  Administered 2023-08-01: 5 mg via ORAL
  Filled 2023-08-01: qty 1

## 2023-08-01 MED ORDER — LEVETIRACETAM 500 MG PO TABS
500.0000 mg | ORAL_TABLET | Freq: Two times a day (BID) | ORAL | Status: DC
  Administered 2023-08-01 – 2023-08-06 (×10): 500 mg via ORAL
  Filled 2023-08-01 (×10): qty 1

## 2023-08-01 MED ORDER — SENNA 8.6 MG PO TABS
2.0000 | ORAL_TABLET | Freq: Once | ORAL | Status: AC
  Administered 2023-08-01: 17.2 mg via ORAL
  Filled 2023-08-01: qty 2

## 2023-08-01 MED ORDER — ENOXAPARIN SODIUM 30 MG/0.3ML IJ SOSY: 30.0000 mg | PREFILLED_SYRINGE | Freq: Two times a day (BID) | INTRAMUSCULAR | Status: DC

## 2023-08-01 MED ORDER — PANTOPRAZOLE SODIUM 40 MG PO TBEC
40.0000 mg | DELAYED_RELEASE_TABLET | Freq: Every day | ORAL | Status: DC
  Administered 2023-08-01 – 2023-08-06 (×6): 40 mg via ORAL
  Filled 2023-08-01 (×6): qty 1

## 2023-08-01 MED ORDER — KETOROLAC TROMETHAMINE 15 MG/ML IJ SOLN
15.0000 mg | Freq: Four times a day (QID) | INTRAMUSCULAR | Status: AC
  Administered 2023-08-01 – 2023-08-06 (×20): 15 mg via INTRAVENOUS
  Filled 2023-08-01 (×20): qty 1

## 2023-08-01 MED ORDER — ONDANSETRON HCL 4 MG/2ML IJ SOLN
4.0000 mg | INTRAMUSCULAR | Status: DC | PRN
  Administered 2023-08-01 – 2023-08-02 (×3): 4 mg via INTRAVENOUS
  Filled 2023-08-01 (×3): qty 2

## 2023-08-01 MED ORDER — METHOCARBAMOL 750 MG PO TABS
750.0000 mg | ORAL_TABLET | Freq: Three times a day (TID) | ORAL | Status: DC
  Administered 2023-08-01 – 2023-08-06 (×16): 750 mg via ORAL
  Filled 2023-08-01 (×16): qty 1

## 2023-08-01 MED ORDER — MAGNESIUM HYDROXIDE 400 MG/5ML PO SUSP
30.0000 mL | Freq: Once | ORAL | Status: AC
  Administered 2023-08-01: 30 mL via ORAL
  Filled 2023-08-01: qty 30
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
## 2023-08-02 MED ORDER — MAGNESIUM CITRATE PO SOLN
0.5000 | Freq: Once | ORAL | Status: AC
  Administered 2023-08-02: 0.5 via ORAL
  Filled 2023-08-02: qty 296

## 2023-08-02 MED ORDER — GABAPENTIN 100 MG PO CAPS
100.0000 mg | ORAL_CAPSULE | Freq: Three times a day (TID) | ORAL | Status: DC
  Administered 2023-08-02 – 2023-08-06 (×13): 100 mg via ORAL
  Filled 2023-08-02 (×13): qty 1
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
## 2023-08-02 NOTE — Plan of Care (Signed)
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
## 2023-08-03 NOTE — Progress Notes (Signed)
Inpatient Rehabilitation Admissions Coordinator   I met with patient at  bedside with visitor. We dicussed again goals and expectations of a possible CIR admit. I have her cousin's, Nancy's, number and will contact her today to discuss caregivers supports. I also discussed her tolerance to participate in AIR level rehab and discharge home in 10 to 12 days. I also contacted her daughter, Babette Relic, by phone. Patient has history of left shoulder issues with those limitations, left leg weakness and her MS that limits her mobility prior to admit. I will discuss case with our Rehab team and follow up tomorrow on final dispo recommendation of CIR vs SNF.  Ottie Glazier, RN, MSN Rehab Admissions Coordinator [phone] 08/03/2023 1:37 PM

## 2023-08-03 NOTE — Plan of Care (Signed)
  Problem: Education: Goal: Knowledge of General Education information will improve Description: Including pain rating scale, medication(s)/side effects and non-pharmacologic comfort measures Outcome: Progressing   Problem: Clinical Measurements: Goal: Will remain free from infection Outcome: Progressing Goal: Diagnostic test results will improve Outcome: Progressing Goal: Respiratory complications will improve Outcome: Progressing Goal: Cardiovascular complication will be avoided Outcome: Progressing   Problem: Activity: Goal: Risk for activity intolerance will decrease Outcome: Progressing   Problem: Coping: Goal: Level of anxiety will decrease Outcome: Progressing   Problem: Safety: Goal: Ability to remain free from injury will improve Outcome: Progressing
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
## 2023-08-04 ENCOUNTER — Inpatient Hospital Stay (HOSPITAL_COMMUNITY): Payer: No Typology Code available for payment source
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
## 2023-08-04 NOTE — NC FL2 (Signed)
MEDICAID FL2 LEVEL OF CARE FORM     IDENTIFICATION  Patient Name: Brenda Sherman Birthdate: 20-Jul-1946 Sex: female Admission Date (Current Location): 07/29/2023  [facility] and [place] Number:  Producer, television/film/video and Address:  [facility] [place], [address] [place], [place] [address]      Provider Number: [id number]  Attending Physician Name and Address:  Md, Trauma, MD  Relative Name and Phone Number:  [facility] Daughter   [phone]    Current Level of Care: Hospital Recommended Level of Care: Skilled Nursing Facility Prior Approval Number:    Date Approved/Denied:   PASRR Number: [id number]  Discharge Plan: SNF    Current Diagnoses: Patient Active Problem List   Diagnosis Date Noted   SDH (subdural hematoma) (HCC) 07/29/2023    Orientation RESPIRATION BLADDER Height & Weight     Self, Time, Situation, Place  Normal Continent, External catheter Weight: 149 lb 11.1 oz (67.9 kg) Height:     BEHAVIORAL SYMPTOMS/MOOD NEUROLOGICAL BOWEL NUTRITION STATUS      Incontinent Diet (see discharge summary)  AMBULATORY STATUS COMMUNICATION OF NEEDS Skin   Extensive Assist Verbally Other (Comment) (ecchymosis)                       Personal Care Assistance Level of Assistance  Bathing, Feeding, Dressing Bathing Assistance: Limited assistance Feeding assistance: Independent Dressing Assistance: Limited assistance     Functional Limitations Info  Sight, Hearing, Speech Sight Info: Adequate Hearing Info: Adequate Speech Info: Adequate    SPECIAL CARE FACTORS FREQUENCY  PT (By licensed PT), OT (By licensed OT)     PT Frequency: 5x week OT Frequency: 5x week            Contractures Contractures Info: Not present    Additional Factors Info  Code Status, Allergies Code Status Info: full Allergies Info: penicillins           Current Medications (08/04/2023):  This is the current hospital active medication  list Current Facility-Administered Medications  Medication Dose Route Frequency Provider Last Rate Last Admin   acetaminophen (TYLENOL) tablet 1,000 mg  1,000 mg Oral Q6H Violeta Gelinas, MD   1,000 mg at 08/04/23 1209   alum & mag hydroxide-simeth (MAALOX/MYLANTA) 200-200-20 MG/5ML suspension 30 mL  30 mL Oral Q6H PRN Moise Boring, MD   30 mL at 07/31/23 2125   docusate sodium (COLACE) capsule 100 mg  100 mg Oral BID Violeta Gelinas, MD   100 mg at 08/04/23 0916   enoxaparin (LOVENOX) injection 30 mg  30 mg Subcutaneous Q12H Calton Dach I, RPH   30 mg at 08/04/23 8413   famotidine (PEPCID) tablet 20 mg  20 mg Oral Daily PRN Diamantina Monks, MD       gabapentin (NEURONTIN) capsule 100 mg  100 mg Oral TID Violeta Gelinas, MD   100 mg at 08/04/23 0916   hydrALAZINE (APRESOLINE) injection 10 mg  10 mg Intravenous Q2H PRN Violeta Gelinas, MD       ketorolac (TORADOL) 15 MG/ML injection 15 mg  15 mg Intravenous Q6H Diamantina Monks, MD   15 mg at 08/04/23 1209   levETIRAcetam (KEPPRA) tablet 500 mg  500 mg Oral BID Diamantina Monks, MD   500 mg at 08/04/23 0917   lidocaine (LIDODERM) 5 % 1 patch  1 patch Transdermal Q24H Diamantina Monks, MD   1 patch at 08/04/23 1209   methocarbamol (ROBAXIN) tablet 750  mg  750 mg Oral Q8H Diamantina Monks, MD   750 mg at 08/04/23 1418   morphine (PF) 2 MG/ML injection 2 mg  2 mg Intravenous Q2H PRN Violeta Gelinas, MD   2 mg at 07/30/23 0413   ondansetron (ZOFRAN-ODT) disintegrating tablet 4 mg  4 mg Oral Q4H PRN Diamantina Monks, MD       Or   ondansetron (ZOFRAN) injection 4 mg  4 mg Intravenous Q4H PRN Diamantina Monks, MD   4 mg at 08/02/23 6578   Oral care mouth rinse  15 mL Mouth Rinse PRN Tressie Stalker, MD       oxyCODONE (Oxy IR/ROXICODONE) immediate release tablet 5-10 mg  5-10 mg Oral Q4H PRN Jacinto Halim, PA-C   5 mg at 08/04/23 0201   pantoprazole (PROTONIX) EC tablet 40 mg  40 mg Oral Daily Diamantina Monks, MD   40 mg at 08/04/23  0917   polyethylene glycol (MIRALAX / GLYCOLAX) packet 17 g  17 g Oral Daily Jacinto Halim, PA-C   17 g at 08/04/23 4696     Discharge Medications: Please see discharge summary for a list of discharge medications.  Relevant Imaging Results:  Relevant Lab Results:   Additional Information SSN: [id number]  Lorri Frederick, LCSW

## 2023-08-04 NOTE — Progress Notes (Signed)
Inpatient Rehabilitation Admissions Coordinator   Case discussed with Dr Riley Kill. Patient not felt to be at a level to tolerate the intensity required of CIRLevel rehab. I met with patient , her sister and her 77 yo Mom at bedside. I also contacted her daughter, Babette Relic, by phone. They are aware that other rehab venues should be pursued. SNF vs Home with HH. I spoke with her cousin, Harriett Sine, yesterday by phone and she can provide care for patient 24/7. RN CM made are. We will sign off.  Ottie Glazier, RN, MSN Rehab Admissions Coordinator [phone] 08/04/2023 12:36 PM
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
## 2023-08-05 NOTE — TOC Progression Note (Signed)
Transition of Care [facility]) - Progression Note    Patient Details  Name: Brenda Sherman MRN: [id number] Date of Birth: 07-Jan-1946  Transition [facility] CM/SW Contact  Glennon Mac, RN Phone Number: 08/05/2023, 4:00pm  Clinical Narrative:    Met with patient to discuss discharge disposition; she states she would like to go home with home health care if possible.  Her family will be able to provide needed assistance at home, and she plans to supplement care with [facility].  She has all needed DME at home, to include RW, BSC and shower seat. She states that her daughter will be discharged from the hospital on Sunday.  PT/OT recommending HH followup; referral to [facility] for continued therapies.  Likely discharge on Saturday, per PA with [facility] services and family support.    Expected Discharge Plan: Home w Home Health Services Barriers to Discharge: Continued Medical Work up  Expected Discharge Plan and Services In-house Referral: Clinical Social Work Discharge Planning Services: CM Consult Post Acute Care Choice: Home Health Living arrangements for the past 2 months: Single Family Home                           HH Arranged: PT, OT HH Agency: [facility] Date [facility] Agency Contacted: 08/05/23 Time HH Agency Contacted: 1652 Representative spoke with at [facility] Agency: Lorenza Chick   Social Determinants of Health (SDOH) Interventions SDOH Screenings   Food Insecurity: Patient Declined (07/29/2023)  Housing: Patient Declined (07/29/2023)  Transportation Needs: Patient Declined (07/29/2023)  Utilities: Patient Declined (07/29/2023)  Social Connections: Unknown (12/31/2021)   Received from [facility]  Tobacco Use: Low Risk  (07/29/2023)    Readmission Risk Interventions     No data to display         Quintella Baton, RN, BSN  Trauma/Neuro ICU Case Manager [phone]
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
## 2023-08-06 MED ORDER — OXYCODONE HCL 5 MG PO TABS: 5.0000 mg | ORAL_TABLET | Freq: Four times a day (QID) | ORAL | 0 refills | Status: AC | PRN

## 2023-08-06 MED ORDER — ACETAMINOPHEN 500 MG PO TABS: 1000.0000 mg | ORAL_TABLET | Freq: Four times a day (QID) | ORAL | Status: AC

## 2023-08-06 MED ORDER — LEVETIRACETAM 500 MG PO TABS: 500.0000 mg | ORAL_TABLET | Freq: Two times a day (BID) | ORAL | 0 refills | Status: AC

## 2023-08-06 MED ORDER — GABAPENTIN 100 MG PO CAPS: 100.0000 mg | ORAL_CAPSULE | Freq: Three times a day (TID) | ORAL | 0 refills | Status: AC | PRN

## 2023-08-06 NOTE — Progress Notes (Signed)
Pt discharge education and instructions completed with pt and granddaughter Abby at the bedside. Both voices understanding and denies any questions. Pt IV and telemetry removed prior to discharge. Pt discharge home with daughter and grandchildren to transport her home. Pt to pick up electronically sent prescriptions from preferred pharmacy on file. Pt transported off unit via wheelchair with belongings to the side. Dionne Bucy RN
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
## 2023-12-31 ENCOUNTER — Encounter: Payer: Self-pay | Admitting: Emergency Medicine

## 2023-12-31 ENCOUNTER — Ambulatory Visit
Admission: EM | Admit: 2023-12-31 | Discharge: 2023-12-31 | Disposition: A | Discharge status: Home / Self Care | Attending: Internal Medicine | Admitting: Internal Medicine

## 2023-12-31 DIAGNOSIS — J301 Allergic rhinitis due to pollen: Secondary | ICD-10-CM | POA: Diagnosis not present

## 2023-12-31 DIAGNOSIS — H6993 Unspecified Eustachian tube disorder, bilateral: Secondary | ICD-10-CM | POA: Diagnosis not present

## 2023-12-31 DIAGNOSIS — H1013 Acute atopic conjunctivitis, bilateral: Secondary | ICD-10-CM

## 2023-12-31 MED ORDER — OLOPATADINE HCL 0.1 % OP SOLN: 1.0000 [drp] | Freq: Two times a day (BID) | OPHTHALMIC | 12 refills | Status: AC

## 2023-12-31 NOTE — ED Triage Notes (Signed)
 Pt c/o sinus infection. States she has pressure in face and ears for 2 weeks.

## 2023-12-31 NOTE — Discharge Instructions (Addendum)
Your symptoms are likely due to environmental allergies.  Avoid exposure to allergens. Take oral antihistamine (Either Zyrtec, Claritin, or Allegra) and use Flonase daily as directed. You can buy these medications over the counter. These medications can take a few days to fully kick in to your body and start working. Return for any new or worsening symptoms.  If your eyes become itchy, you may purchase olopatadine (Pataday) eyedrops over the counter and use as directed to relieve watery/itchy eyes associated with allergies as well.   If your symptoms are severe, please go to the emergency room for further evaluation. Schedule an appointment with your primary care provider for follow-up and further management of your seasonal allergies as well as ongoing preventive healthcare.

## 2023-12-31 NOTE — ED Provider Notes (Signed)
 Brenda Sherman    CSN: [id number] Arrival date & time: 12/31/23  1111      History   Chief Complaint Chief Complaint  Patient presents with   Nasal Congestion    HPI Brenda Sherman is a 78 y.o. female.   Patient presents to urgent care for evaluation of nasal congestion, watery itchy eyes, and bilateral ear pain that started approximately 2 weeks ago.  Nasal congestion is clear.  Both ears feel "full" and she reports decreased hearing from the ears.  She has not noticed any drainage from the ears and denies fever, chills, tinnitus, dizziness, headache, sore throat, cough, and rash.  No vision changes.  Reports watery drainage from the eyes intermittently as well as some eye itching.  No eye redness.  No shortness of breath, chest pain, heart palpitations, leg swelling, or orthopnea. History of seasonal allergies, she is currently taking Zyrtec inconsistently.  She takes Zyrtec maybe 1 or 2 times per week.  She uses Flonase every day 1 puff into each nostril but states this is not helping very much with her congestion.  No recent antibiotic or steroid use.     Past Medical History:  Diagnosis Date   MS (multiple sclerosis) [facility])     Patient Active Problem List   Diagnosis Date Noted   SDH (subdural hematoma) (HCC) 07/29/2023    History reviewed. No pertinent surgical history.  OB History   No obstetric history on file.      Home Medications    Prior to Admission medications   Medication Sig Start Date End Date Taking? Authorizing Provider  olopatadine (PATANOL) 0.1 % ophthalmic solution Place 1 drop into both eyes 2 (two) times daily. 12/31/23  Yes Starlene Eaton, FNP  acetaminophen  (TYLENOL ) 500 MG tablet Take 2 tablets (1,000 mg total) by mouth every 6 (six) hours. 08/06/23   Melvenia Stabs, MD  ergocalciferol (VITAMIN D2) 1.25 MG (50000 UT) capsule Take 50,000 Units by mouth once a week. On Saturdays    [provider]  famotidine   (PEPCID ) 20 MG tablet Take 20 mg by mouth daily as needed for heartburn or indigestion.    [provider]  gabapentin  (NEURONTIN ) 100 MG capsule Take 1 capsule (100 mg total) by mouth 3 (three) times daily as needed. 08/06/23   Melvenia Stabs, MD  ibuprofen (ADVIL,MOTRIN) 200 MG tablet Take 200 mg by mouth every 6 (six) hours as needed (for pain.).    [provider]  levETIRAcetam  (KEPPRA ) 500 MG tablet Take 1 tablet (500 mg total) by mouth 2 (two) times daily for 3 days. 08/06/23 08/09/23  Melvenia Stabs, MD  omeprazole (PRILOSEC) 20 MG capsule Take 20 mg by mouth daily.    [provider]  oxyCODONE  (OXY IR/ROXICODONE ) 5 MG immediate release tablet Take 1 tablet (5 mg total) by mouth every 6 (six) hours as needed (pain). 08/06/23   Melvenia Stabs, MD    Family History History reviewed. No pertinent family history.  Social History Social History   Tobacco Use   Smoking status: Never   Smokeless tobacco: Never  Vaping Use   Vaping status: Never Used  Substance Use Topics   Alcohol use: No   Drug use: No     Allergies   Penicillins   Review of Systems Review of Systems Per HPI  Physical Exam Triage Vital Signs ED Triage Vitals  Encounter Vitals Group     BP 12/31/23 1142 (!) 144/66  Systolic BP Percentile --      Diastolic BP Percentile --      Pulse Rate 12/31/23 1142 73     Resp --      Temp 12/31/23 1142 97.6 F (36.4 C)     Temp Source 12/31/23 1137 Oral     SpO2 12/31/23 1142 96 %     Weight --      Height --      Head Circumference --      Peak Flow --      Pain Score 12/31/23 1141 0     Pain Loc --      Pain Education --      Exclude from Growth Chart --    No data found.  Updated Vital Signs BP (!) 144/66 (BP Location: Left Arm)   Pulse 73   Temp 97.6 F (36.4 C) (Oral)   SpO2 96%   Visual Acuity Right Eye Distance:   Left Eye Distance:   Bilateral Distance:    Right Eye Near:   Left Eye  Near:    Bilateral Near:     Physical Exam Vitals and nursing note reviewed.  Constitutional:      Appearance: Normal appearance. She is not ill-appearing or toxic-appearing.  HENT:     Head: Normocephalic and atraumatic.     Right Ear: Hearing, tympanic membrane, ear canal and external ear normal.     Left Ear: Hearing, tympanic membrane, ear canal and external ear normal.     Nose: Rhinorrhea present. Rhinorrhea is clear.     Right Turbinates: Swollen and pale.     Left Turbinates: Swollen and pale.     Right Sinus: No maxillary sinus tenderness or frontal sinus tenderness.     Left Sinus: No maxillary sinus tenderness or frontal sinus tenderness.     Mouth/Throat:     Lips: Pink.     Mouth: Mucous membranes are moist. No injury or oral lesions.     Dentition: Normal dentition.     Tongue: No lesions.     Pharynx: Oropharynx is clear. Uvula midline. No pharyngeal swelling, oropharyngeal exudate, posterior oropharyngeal erythema, uvula swelling or postnasal drip.     Tonsils: No tonsillar exudate.  Eyes:     General: Lids are normal. Vision grossly intact. Gaze aligned appropriately.     Extraocular Movements: Extraocular movements intact.     Conjunctiva/sclera: Conjunctivae normal.     Right eye: Right conjunctiva is not injected.     Left eye: Left conjunctiva is not injected.     Pupils: Pupils are equal, round, and reactive to light.     Visual Fields: Right eye visual fields normal and left eye visual fields normal.  Neck:     Trachea: Trachea and phonation normal.  Cardiovascular:     Rate and Rhythm: Normal rate and regular rhythm.     Heart sounds: Normal heart sounds, S1 normal and S2 normal.  Pulmonary:     Effort: Pulmonary effort is normal. No respiratory distress.     Breath sounds: Normal breath sounds and air entry. No wheezing, rhonchi or rales.  Chest:     Chest wall: No tenderness.  Musculoskeletal:     Cervical back: Neck supple.     Right lower leg: No  edema.     Left lower leg: No edema.  Lymphadenopathy:     Cervical: No cervical adenopathy.  Skin:    General: Skin is warm and dry.  Capillary Refill: Capillary refill takes less than 2 seconds.     Findings: No rash.  Neurological:     General: No focal deficit present.     Mental Status: She is alert and oriented to person, place, and time. Mental status is at baseline.     Cranial Nerves: No dysarthria or facial asymmetry.  Psychiatric:        Mood and Affect: Mood normal.        Speech: Speech normal.        Behavior: Behavior normal.        Thought Content: Thought content normal.        Judgment: Judgment normal.      Sherman Treatments / Results  Labs (all labs ordered are listed, but only abnormal results are displayed) Labs Reviewed - No data to display  EKG   Radiology No results found.  Procedures Procedures (including critical care time)  Medications Ordered in Sherman Medications - No data to display  Initial Impression / Assessment and Plan / Sherman Course  I have reviewed the triage vital signs and the nursing notes.  Pertinent labs & imaging results that were available during my care of the patient were reviewed by me and considered in my medical decision making (see chart for details).   1.  Allergic conjunctivitis of both eyes, seasonal allergic rhinitis due to pollen, dysfunction of both eustachian tubes Presentation consistent with eustachian tube dysfunction.  No signs of otitis media, TMJ, injury.  Flonase and oral antihistamine recommended. Tylenol  as needed for pain.   Symptoms due to allergic rhinitis and conjunctivitis.  Low suspicion for bacterial sinusitis. Discussed supportive care with OTC antihistamine, Flonase daily.  Low suspicion for viral URI, therefore deferred testing. Lungs clear, vitals hemodynamically stable, therefore deferred imaging of the chest.  Counseled patient on potential for adverse effects with medications  prescribed/recommended today, strict ER and return-to-clinic precautions discussed, patient verbalized understanding.    Final Clinical Impressions(s) / Sherman Diagnoses   Final diagnoses:  Seasonal allergic rhinitis due to pollen  Allergic conjunctivitis of both eyes  Dysfunction of both eustachian tubes   Discharge Instructions      Your symptoms are likely due to environmental allergies.  Avoid exposure to allergens. Take oral antihistamine (Either Zyrtec, Claritin, or Allegra) and use Flonase daily as directed. You can buy these medications over the counter. These medications can take a few days to fully kick in to your body and start working. Return for any new or worsening symptoms.  If your eyes become itchy, you may purchase olopatadine (Pataday) eyedrops over the counter and use as directed to relieve watery/itchy eyes associated with allergies as well.   If your symptoms are severe, please go to the emergency room for further evaluation. Schedule an appointment with your primary care provider for follow-up and further management of your seasonal allergies as well as ongoing preventive healthcare.   ED Prescriptions     Medication Sig Dispense Auth. Provider   olopatadine (PATANOL) 0.1 % ophthalmic solution Place 1 drop into both eyes 2 (two) times daily. 5 mL Starlene Eaton, FNP      PDMP not reviewed this encounter.   Starlene Eaton, [place] 12/31/23 1320

## 2024-09-14 ENCOUNTER — Encounter: Payer: Self-pay | Admitting: Emergency Medicine

## 2024-09-14 ENCOUNTER — Ambulatory Visit: Admission: EM | Admit: 2024-09-14 | Discharge: 2024-09-14 | Disposition: A | Discharge status: Home / Self Care

## 2024-09-14 DIAGNOSIS — N3000 Acute cystitis without hematuria: Secondary | ICD-10-CM | POA: Insufficient documentation

## 2024-09-14 DIAGNOSIS — R350 Frequency of micturition: Secondary | ICD-10-CM | POA: Diagnosis present

## 2024-09-14 DIAGNOSIS — R300 Dysuria: Secondary | ICD-10-CM | POA: Insufficient documentation

## 2024-09-14 LAB — POCT URINE DIPSTICK
Bilirubin, UA: NEGATIVE
Glucose, UA: NEGATIVE mg/dL
Ketones, POC UA: NEGATIVE mg/dL
Nitrite, UA: NEGATIVE
POC PROTEIN,UA: NEGATIVE
Spec Grav, UA: 1.01
Urobilinogen, UA: 0.2 U/dL
pH, UA: 6

## 2024-09-14 MED ORDER — SULFAMETHOXAZOLE-TRIMETHOPRIM 800-160 MG PO TABS: 1.0000 | ORAL_TABLET | Freq: Two times a day (BID) | ORAL | 0 refills | Status: AC

## 2024-09-14 NOTE — Discharge Instructions (Signed)
 Will send urine to lab, if bacteria is found to be resistant to bactrim  we will call you and prescribe a different antibiotic

## 2024-09-14 NOTE — ED Provider Notes (Signed)
 " [facility]    CSN: [id number] Arrival date & time: 09/14/24  1307      History   Chief Complaint Chief Complaint  Patient presents with   Urinary Tract Infection    HPI Brenda Sherman is a 79 y.o. female.   Pt presents today due to 3 days of increase lower abdominal pressure and urinary frequency.   The history is provided by the patient.  Urinary Tract Infection   Past Medical History:  Diagnosis Date   MS (multiple sclerosis)     Patient Active Problem List   Diagnosis Date Noted   SDH (subdural hematoma) (HCC) 07/29/2023   Multiple sclerosis 10/11/2017    History reviewed. No pertinent surgical history.  OB History   No obstetric history on file.      Home Medications    Prior to Admission medications  Medication Sig Start Date End Date Taking? Authorizing Provider  sulfamethoxazole-trimethoprim (BACTRIM DS) 800-160 MG tablet Take 1 tablet by mouth 2 (two) times daily for 5 days. 09/14/24 09/19/24 Yes Andra Corean BROCKS, PA-C  acetaminophen  (TYLENOL ) 500 MG tablet Take 2 tablets (1,000 mg total) by mouth every 6 (six) hours. 08/06/23   Teresa Lonni HERO, MD  baclofen  (LIORESAL ) 10 MG tablet     [provider]  ergocalciferol (VITAMIN D2) 1.25 MG (50000 UT) capsule Take 50,000 Units by mouth once a week. On Saturdays    [provider]  famotidine  (PEPCID ) 20 MG tablet Take 20 mg by mouth daily as needed for heartburn or indigestion.    [provider]  gabapentin  (NEURONTIN ) 100 MG capsule Take 1 capsule (100 mg total) by mouth 3 (three) times daily as needed. 08/06/23   Teresa Lonni HERO, MD  ibuprofen (ADVIL,MOTRIN) 200 MG tablet Take 200 mg by mouth every 6 (six) hours as needed (for pain.).    [provider]  levETIRAcetam  (KEPPRA ) 500 MG tablet Take 1 tablet (500 mg total) by mouth 2 (two) times daily for 3 days. 08/06/23 08/09/23  Teresa Lonni HERO, MD  olopatadine  (PATANOL) 0.1 % ophthalmic  solution Place 1 drop into both eyes 2 (two) times daily. 12/31/23   Enedelia Dorna HERO, FNP  omeprazole (PRILOSEC) 20 MG capsule Take 20 mg by mouth daily.    [provider]  oxyCODONE  (OXY IR/ROXICODONE ) 5 MG immediate release tablet Take 1 tablet (5 mg total) by mouth every 6 (six) hours as needed (pain). 08/06/23   Teresa Lonni HERO, MD    Family History History reviewed. No pertinent family history.  Social History Social History[1]   Allergies   Penicillins   Review of Systems Review of Systems   Physical Exam Triage Vital Signs ED Triage Vitals  Encounter Vitals Group     BP 09/14/24 1448 (!) 206/82     Girls Systolic BP Percentile --      Girls Diastolic BP Percentile --      Boys Systolic BP Percentile --      Boys Diastolic BP Percentile --      Pulse Rate 09/14/24 1448 87     Resp 09/14/24 1448 18     Temp 09/14/24 1448 98.5 F (36.9 C)     Temp Source 09/14/24 1448 Oral     SpO2 09/14/24 1448 98 %     Weight 09/14/24 1447 149 lb 11.1 oz (67.9 kg)     Height --      Head Circumference --      Peak Flow --  Pain Score 09/14/24 1446 3     Pain Loc --      Pain Education --      Exclude from Growth Chart --    No data found.  Updated Vital Signs BP (!) 206/82 (BP Location: Right Arm)   Pulse 87   Temp 98.5 F (36.9 C) (Oral)   Resp 18   Wt 149 lb 11.1 oz (67.9 kg)   SpO2 98%   Visual Acuity Right Eye Distance:   Left Eye Distance:   Bilateral Distance:    Right Eye Near:   Left Eye Near:    Bilateral Near:     Physical Exam Vitals and nursing note reviewed.  Constitutional:      General: She is not in acute distress.    Appearance: Normal appearance. She is not ill-appearing, toxic-appearing or diaphoretic.  Eyes:     General: No scleral icterus. Cardiovascular:     Rate and Rhythm: Normal rate and regular rhythm.     Heart sounds: Normal heart sounds.  Pulmonary:     Effort: Pulmonary effort is normal. No  respiratory distress.     Breath sounds: Normal breath sounds. No wheezing or rhonchi.  Abdominal:     General: Abdomen is flat. Bowel sounds are normal.     Palpations: Abdomen is soft.     Tenderness: There is abdominal tenderness in the right upper quadrant, right lower quadrant, suprapubic area, left upper quadrant and left lower quadrant. There is left CVA tenderness. There is no right CVA tenderness.  Skin:    General: Skin is warm.  Neurological:     Mental Status: She is alert and oriented to person, place, and time.  Psychiatric:        Mood and Affect: Mood normal.        Behavior: Behavior normal.      UC Treatments / Results  Labs (all labs ordered are listed, but only abnormal results are displayed) Labs Reviewed  POCT URINE DIPSTICK - Abnormal; Notable for the following components:      Result Value   Blood, UA trace-intact (*)    Leukocytes, UA Small (1+) (*)    All other components within normal limits  URINE CULTURE    EKG   Radiology No results found.  Procedures Procedures (including critical care time)  Medications Ordered in UC Medications - No data to display  Initial Impression / Assessment and Plan / UC Course  I have reviewed the triage vital signs and the nursing notes.  Pertinent labs & imaging results that were available during my care of the patient were reviewed by me and considered in my medical decision making (see chart for details).     Final Clinical Impressions(s) / UC Diagnoses   Final diagnoses:  Dysuria  Acute cystitis without hematuria     Discharge Instructions      Will send urine to lab, if bacteria is found to be resistant to bactrim we will call you and prescribe a different antibiotic    ED Prescriptions     Medication Sig Dispense Auth. Provider   sulfamethoxazole-trimethoprim (BACTRIM DS) 800-160 MG tablet Take 1 tablet by mouth 2 (two) times daily for 5 days. 10 tablet Andra Corean BROCKS, PA-C       PDMP not reviewed this encounter.    [1]  Social History Tobacco Use   Smoking status: Never    Passive exposure: Never   Smokeless tobacco: Never  Vaping Use  Vaping status: Never Used  Substance Use Topics   Alcohol use: No   Drug use: No     Andra Corean BROCKS, PA-C 09/14/24 1532  "

## 2024-09-14 NOTE — ED Triage Notes (Signed)
 Pt presents c/o UTI x 3 days. Pt states,  I think I have a UTI. I have had them before so I am familiar with the feeling and that's kinda how this feels. I keep getting the urge to go to the restroom and there's pressure in the bottom of my belly after stream.

## 2024-09-15 LAB — URINE CULTURE: Culture: <10000 — AB
